# Patient Record
Sex: Male | Born: 1975 | Race: White | Hispanic: No | Marital: Married | Smoking: Never smoker
Health system: Southern US, Community
[De-identification: ages and names within clinical notes are randomized; demographics above are authoritative.]

## PROBLEM LIST (undated history)

## (undated) DIAGNOSIS — F4310 Post-traumatic stress disorder, unspecified: Secondary | ICD-10-CM

## (undated) DIAGNOSIS — F32A Depression, unspecified: Secondary | ICD-10-CM

## (undated) DIAGNOSIS — F329 Major depressive disorder, single episode, unspecified: Secondary | ICD-10-CM

## (undated) HISTORY — DX: Depression, unspecified: F32.A

## (undated) HISTORY — DX: Post-traumatic stress disorder, unspecified: F43.10

## (undated) HISTORY — PX: MOUTH SURGERY: SHX715

## (undated) HISTORY — DX: Major depressive disorder, single episode, unspecified: F32.9

## (undated) HISTORY — PX: WISDOM TOOTH EXTRACTION: SHX21

---

## 2015-01-20 ENCOUNTER — Institutional Professional Consult (permissible substitution): Payer: BLUE CROSS/BLUE SHIELD | Admitting: Neurology

## 2015-01-20 ENCOUNTER — Telehealth: Payer: Self-pay | Admitting: Neurology

## 2015-01-20 NOTE — Telephone Encounter (Signed)
Pt is in the phone. He is on [address], [place], he is running late for appt. Should he come on or RS?

## 2015-01-20 NOTE — Telephone Encounter (Signed)
Pt needs to reschedule. He has missed more than half of his appt.

## 2015-04-24 DIAGNOSIS — Z0289 Encounter for other administrative examinations: Secondary | ICD-10-CM

## 2015-06-12 ENCOUNTER — Ambulatory Visit (INDEPENDENT_AMBULATORY_CARE_PROVIDER_SITE_OTHER): Payer: BLUE CROSS/BLUE SHIELD | Admitting: Neurology

## 2015-06-12 ENCOUNTER — Encounter: Payer: Self-pay | Admitting: Neurology

## 2015-06-12 ENCOUNTER — Encounter (INDEPENDENT_AMBULATORY_CARE_PROVIDER_SITE_OTHER): Payer: Self-pay

## 2015-06-12 VITALS — BP 144/100 | HR 94 | Resp 20 | Ht 73.0 in | Wt 229.0 lb

## 2015-06-12 DIAGNOSIS — R0683 Snoring: Secondary | ICD-10-CM | POA: Diagnosis not present

## 2015-06-12 DIAGNOSIS — G479 Sleep disorder, unspecified: Secondary | ICD-10-CM

## 2015-06-12 DIAGNOSIS — G4730 Sleep apnea, unspecified: Secondary | ICD-10-CM

## 2015-06-12 DIAGNOSIS — R5383 Other fatigue: Secondary | ICD-10-CM | POA: Diagnosis not present

## 2015-06-12 DIAGNOSIS — G4700 Insomnia, unspecified: Secondary | ICD-10-CM | POA: Diagnosis not present

## 2015-06-12 NOTE — Patient Instructions (Signed)
Insomnia Insomnia is a sleep disorder that makes it difficult to fall asleep or to stay asleep. Insomnia can cause tiredness (fatigue), low energy, difficulty concentrating, mood swings, and poor performance at work or school.  There are three different ways to classify insomnia:  Difficulty falling asleep.  Difficulty staying asleep.  Waking up too early in the morning. Any type of insomnia can be long-term (chronic) or short-term (acute). Both are common. Short-term insomnia usually lasts for three months or less. Chronic insomnia occurs at least three times a week for longer than three months. CAUSES  Insomnia may be caused by another condition, situation, or substance, such as:  Anxiety.  Certain medicines.  Gastroesophageal reflux disease (GERD) or other gastrointestinal conditions.  Asthma or other breathing conditions.  Restless legs syndrome, sleep apnea, or other sleep disorders.  Chronic pain.  Menopause. This may include hot flashes.  Stroke.  Abuse of alcohol, tobacco, or illegal drugs.  Depression.  Caffeine.   Neurological disorders, such as Alzheimer disease.  An overactive thyroid (hyperthyroidism). The cause of insomnia may not be known. RISK FACTORS Risk factors for insomnia include:  Gender. Women are more commonly affected than men.  Age. Insomnia is more common as you get older.  Stress. This may involve your professional or personal life.  Income. Insomnia is more common in people with lower income.  Lack of exercise.   Irregular work schedule or night shifts.  Traveling between different time zones. SIGNS AND SYMPTOMS If you have insomnia, trouble falling asleep or trouble staying asleep is the main symptom. This may lead to other symptoms, such as:  Feeling fatigued.  Feeling nervous about going to sleep.  Not feeling rested in the morning.  Having trouble concentrating.  Feeling irritable, anxious, or depressed. TREATMENT   Treatment for insomnia depends on the cause. If your insomnia is caused by an underlying condition, treatment will focus on addressing the condition. Treatment may also include:   Medicines to help you sleep.  Counseling or therapy.  Lifestyle adjustments. HOME CARE INSTRUCTIONS   Take medicines only as directed by your health care provider.  Keep regular sleeping and waking hours. Avoid naps.  Keep a sleep diary to help you and your health care provider figure out what could be causing your insomnia. Include:   When you sleep.  When you wake up during the night.  How well you sleep.   How rested you feel the next day.  Any side effects of medicines you are taking.  What you eat and drink.   Make your bedroom a comfortable place where it is easy to fall asleep:  Put up shades or special blackout curtains to block light from outside.  Use a white noise machine to block noise.  Keep the temperature cool.   Exercise regularly as directed by your health care provider. Avoid exercising right before bedtime.  Use relaxation techniques to manage stress. Ask your health care provider to suggest some techniques that may work well for you. These may include:  Breathing exercises.  Routines to release muscle tension.  Visualizing peaceful scenes.  Cut back on alcohol, caffeinated beverages, and cigarettes, especially close to bedtime. These can disrupt your sleep.  Do not overeat or eat spicy foods right before bedtime. This can lead to digestive discomfort that can make it hard for you to sleep.  Limit screen use before bedtime. This includes:  Watching TV.  Using your smartphone, tablet, and computer.  Stick to a routine. This   can help you fall asleep faster. Try to do a quiet activity, brush your teeth, and go to bed at the same time each night.  Get out of bed if you are still awake after 15 minutes of trying to sleep. Keep the lights down, but try reading or  doing a quiet activity. When you feel sleepy, go back to bed.  Make sure that you drive carefully. Avoid driving if you feel very sleepy.  Keep all follow-up appointments as directed by your health care provider. This is important. SEEK MEDICAL CARE IF:   You are tired throughout the day or have trouble in your daily routine due to sleepiness.  You continue to have sleep problems or your sleep problems get worse. SEEK IMMEDIATE MEDICAL CARE IF:   You have serious thoughts about hurting yourself or someone else.   This information is not intended to replace advice given to you by your health care provider. Make sure you discuss any questions you have with your health care provider.   Document Released: 05/21/2000 Document Revised: 02/12/2015 Document Reviewed: 02/22/2014 Elsevier Interactive Patient Education 2016 [employer] Inc[employer]  

## 2015-06-12 NOTE — Progress Notes (Signed)
SLEEP MEDICINE CLINIC   Provider:  Melvyn Novasarmen  Aimee Timmons, M D  Referring Provider: Albertina Parr'Neal, Leslie C, NP Primary Care Physician:  No primary care provider on file.  Chief Complaint  Patient presents with  . New Patient (Initial Visit)    snoring, excessively tired during the daytime, rm 11, alone    HPI:  Randy Gonzales is a 40 y.o. male , seen here as a referral from NP O'Neal for a sleep evaluation. Mr. Randy Gonzales is a 40 year old married Caucasian father of 2, who reports that he feels neither restored normal refreshed after a night of sleep. He feels tired not necessarily excessively sleepy but fatigued and stressed out. He has been witnessed to snore and have apneas by his wife. His sleep is also fragmented and he wakes up spontaneously up multiple times at night, only one time would be related to the urge to urinate. Since the couple has young children some of the interruptions of sleep related to the activities of the youngsters. He does not feel her desire necessarily to sleep in daytime and does not doze off when physically inactive or mentally not stimulated. He reports that he desires to sleep that he does not have any associated fevers of nightmares, or any panic or anxiety disorder that would be affecting and waking him from sleep. He used to worry more at night, had recurent thoughts, but this changed with becoming a father. He goes easier sleep, just not worries as much. 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 [place] , non combat.   He does not report frequent headaches. During this winter season with the heated forced air he often has a dry mouth and is congested in his nasal passage.  He works regular daytime hours, and he uses a lot of caffeine during the day. Usually he drinks soda and tea but not coffee. He estimates his daily caffeine beverage use to be about 2-4 large cups. 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    Sleep habits are as follows: Sometimes he  falls asleep putting the children to bed, usually around 9.30- 10  pm is his bedtime. He would be asleep within 30 minutes, he wakes up multiple times at variable times.  Marital bedroom is described as core, quiet and dark. Sometimes one of the children do wonder in the most nights this is not the case. The couple also has a cat and a dog but these do not share the same bedroom. His wife has noted him to snore and she feels that he moves around in bed during the night a lot. The patient himself confirms this. She also experiences pauses of breathing , while he is not feeling as if he has a breath holding spell at night. He has never woken up gasping or air hungry or panic due to irregular breathing. He endorsed the Epworth sleepiness score at only 3 points which is below average but the fatigue severity score was endorsed at 51 points. The degree of fatigue seems to have increased over the last year, beginning over 3 years ago.     Sleep medical history and family sleep history:  Weight gain seemed to have induced the snoring.  Positive paternal or maternal history for  snoring , paternal uncle with OSA and on CPAP, morbidly obese.   Social history: married, full time gainfully employed, father of 2 , non smoker.  Non ETOH, social - not alone. .   Review of Systems: Out  of a complete 14 system review, the patient complains of only the following symptoms, and all other reviewed systems are negative.  Epworth score 3  , Fatigue severity score 52  , depression score see below PHQ 9.    Social History   Social History  . Marital Status: Unknown    Spouse Name: N/A  . Number of Children: N/A  . Years of Education: N/A   Occupational History  . [employer]    Social History Main Topics  . Smoking status: Never Smoker   . Smokeless tobacco: Not on file  . Alcohol Use: 0.0 oz/week    0 Standard drinks or equivalent per week     Comment: socially  . Drug Use: Not on file  . Sexual Activity:  Not on file   Other Topics Concern  . Not on file   Social History Narrative   Consumes 2-3 caffeinated beverages daily.    Family History  Problem Relation Age of Onset  . Allergies Mother   . Asthma Father   . Allergies Brother   . Diabetes Mellitus II Paternal Uncle   . Prostate cancer Paternal Uncle   . Liver cancer Paternal Uncle   . Depression Paternal Uncle     Past Medical History  Diagnosis Date  . Depression   . PTSD (post-traumatic stress disorder)   . Depression     Past Surgical History  Procedure Laterality Date  . Wisdom tooth extraction    . Mouth surgery      teeth fixed, teeth pulled out, teeth chipped    Current Outpatient Prescriptions  Medication Sig Dispense Refill  . DULoxetine (CYMBALTA) 60 MG capsule Take 60 mg by mouth daily.    . flintstones complete (FLINTSTONES) 60 MG chewable tablet Chew 1 tablet by mouth daily.     No current facility-administered medications for this visit.    Allergies as of 06/12/2015  . (No Known Allergies)    Vitals: BP 144/100 mmHg  Pulse 94  Resp 20  Ht 6\' 1"  (1.854 m)  Wt 229 lb (103.874 kg)  BMI 30.22 kg/m2 Last Weight:  Wt Readings from Last 1 Encounters:  06/12/15 229 lb (103.874 kg)   XBJ:YNWG mass index is 30.22 kg/(m^2).     Last Height:   Ht Readings from Last 1 Encounters:  06/12/15 6\' 1"  (1.854 m)    Physical exam:  General: The patient is awake, alert and appears not in acute distress. The patient is well groomed. Head: Normocephalic, atraumatic. Neck is supple. Mallampati,  neck circumference: 17. Nasal airflow unrestricted , TMJ is  evident . Retrognathia is seen.  Cardiovascular:  Regular rate and rhythm , without  murmurs or carotid bruit, and without distended neck veins. Respiratory: Lungs are clear to auscultation. Skin:  Without evidence of edema, or rash Trunk: BMI is elevated . The patient's posture is hunched.  Neurologic exam : The patient is awake and alert, oriented  to place and time.   Memory subjective  described as intact.   Attention span & concentration ability appears normal.  Speech is fluent,  Without dysarthria, dysphonia or aphasia.  Mood and affect are appropriate.  Cranial nerves: Pupils are equal and briskly reactive to light. Funduscopic exam deferred .  Extraocular movements  in vertical and horizontal planes intact and without nystagmus. Visual fields by finger perimetry are intact. Hearing to finger rub intact.   Facial sensation intact to fine touch.  Facial motor strength is symmetric and tongue and  uvula move midline. Shoulder shrug was symmetrical.   Motor exam:  Elevated tone, failure to relax. symmetric tone , muscle bulk and symmetric strength in all extremities.  Sensory:  Fine touch, pinprick and vibration were tested in all extremities. Proprioception tested in the upper extremities was normal.  Coordination:  Finger-to-nose maneuver  normal without evidence of ataxia, dysmetria or tremor.  Gait and station: Patient walks without assistive device and is able unassisted to climb up to the exam table. Strength within normal limits.  Stance is stable and normal.  Deep tendon reflexes: in the  upper and lower extremities are symmetric and intact. DTR patellae are brisk. No clonus   Babinski maneuver response is  downgoing.  The patient was advised of the nature of the diagnosed sleep disorder , the treatment options and risks for general a health and wellness arising from not treating the condition.  I spent more than 40 minutes of face to face time with the patient. Greater than 50% of time was spent in counseling and coordination of care. We have discussed the diagnosis and differential and I answered the patient's questions.     Assessment:  After physical and neurologic examination, review of laboratory studies,  Personal review of imaging studies, reports of other /same  Imaging studies ,  Results of polysomnography/  neurophysiology testing and pre-existing records as far as provided in visit., my assessment is   Mr. Randy Gonzales has an unremarkable general neurologic exam. The only 2 neuromuscular findings were a elevated tone in both upper extremities, and brisk patella reflexes. He occasionally reports some by cramping or lower back pain. This may also be related to posture at work since he works at [employer]. His desk space allows him to look at her window but there is no natural daylight directly cost at his workplace. His fatigue has increased over the last 3 years perhaps but over the last year has become more so and somewhat burdensome, but is not associated with excessive daytime sleepiness. Based on his wife's report that he snores and stops breathing at night I assume that he will have some degree of apnea. I will invite him for an attended sleep study with the goal to monitor for nocturnal breathing in the lateral as well as in the supine sleep position. If we find an apnea index of over 15 per hour of sleep CPAP should be initiated. For milder apneas not associated with hypoxemia a dental device is often sufficient and treat snoring just as well. There is also less complication with claustrophobia.  2) I order a PSG split with parasomnia montage - good video and audio needed. PTSD patient.   3) I was checked for Vit D deficiency, positive. 2000 units of Vit D daily,  testosterone deficiency is not as likely as he reports no loss of libido.      B complex recommended. Try a 5 mg melatonin.    Plan:  Treatment plan and additional workup : sleep hygiene improvement discussed.      Randy Mylar Artemis Loyal MD  06/12/2015   CC: Randy Parr [address] [place], [place] [address]

## 2018-04-17 ENCOUNTER — Encounter (HOSPITAL_COMMUNITY): Payer: Self-pay

## 2018-04-17 ENCOUNTER — Emergency Department (HOSPITAL_COMMUNITY): Payer: BLUE CROSS/BLUE SHIELD

## 2018-04-17 ENCOUNTER — Emergency Department (HOSPITAL_COMMUNITY)
Admission: EM | Admit: 2018-04-17 | Discharge: 2018-04-17 | Disposition: A | Discharge status: Home / Self Care | Payer: BLUE CROSS/BLUE SHIELD | Attending: Emergency Medicine | Admitting: Emergency Medicine

## 2018-04-17 DIAGNOSIS — R202 Paresthesia of skin: Secondary | ICD-10-CM | POA: Insufficient documentation

## 2018-04-17 DIAGNOSIS — R200 Anesthesia of skin: Secondary | ICD-10-CM | POA: Diagnosis present

## 2018-04-17 DIAGNOSIS — M542 Cervicalgia: Secondary | ICD-10-CM | POA: Diagnosis not present

## 2018-04-17 LAB — CBC WITH DIFFERENTIAL/PLATELET
Abs Immature Granulocytes: 0.03 10*3/uL (ref 0.00–0.07)
BASOS PCT: 0 %
Basophils Absolute: 0 10*3/uL (ref 0.0–0.1)
EOS ABS: 0 10*3/uL (ref 0.0–0.5)
EOS PCT: 0 %
HEMATOCRIT: 44.3 % (ref 39.0–52.0)
Hemoglobin: 14.6 g/dL (ref 13.0–17.0)
Immature Granulocytes: 1 %
LYMPHS ABS: 0.9 10*3/uL (ref 0.7–4.0)
Lymphocytes Relative: 14 %
MCH: 29 pg (ref 26.0–34.0)
MCHC: 33 g/dL (ref 30.0–36.0)
MCV: 87.9 fL (ref 80.0–100.0)
MONOS PCT: 2 %
Monocytes Absolute: 0.1 10*3/uL (ref 0.1–1.0)
Neutro Abs: 5.3 10*3/uL (ref 1.7–7.7)
Neutrophils Relative %: 83 %
Platelets: 259 10*3/uL (ref 150–400)
RBC: 5.04 MIL/uL (ref 4.22–5.81)
RDW: 12.1 % (ref 11.5–15.5)
WBC: 6.3 10*3/uL (ref 4.0–10.5)
nRBC: 0 % (ref 0.0–0.2)

## 2018-04-17 LAB — COMPREHENSIVE METABOLIC PANEL
ALBUMIN: 4.3 g/dL (ref 3.5–5.0)
ALT: 75 U/L — ABNORMAL HIGH (ref 0–44)
ANION GAP: 6 (ref 5–15)
AST: 39 U/L (ref 15–41)
Alkaline Phosphatase: 36 U/L — ABNORMAL LOW (ref 38–126)
BILIRUBIN TOTAL: 0.5 mg/dL (ref 0.3–1.2)
BUN: 9 mg/dL (ref 6–20)
CHLORIDE: 106 mmol/L (ref 98–111)
CO2: 25 mmol/L (ref 22–32)
Calcium: 9.5 mg/dL (ref 8.9–10.3)
Creatinine, Ser: 1.09 mg/dL (ref 0.61–1.24)
GFR calc Af Amer: >60 mL/min (ref >60)
GLUCOSE: 135 mg/dL — AB (ref 70–99)
POTASSIUM: 4 mmol/L (ref 3.5–5.1)
Sodium: 137 mmol/L (ref 135–145)
Total Protein: 7.5 g/dL (ref 6.5–8.1)

## 2018-04-17 LAB — I-STAT TROPONIN, ED: Troponin i, poc: 0 ng/mL (ref 0.00–0.08)

## 2018-04-17 MED ORDER — NAPROXEN 500 MG PO TABS: 500.0000 mg | ORAL_TABLET | Freq: Two times a day (BID) | ORAL | 0 refills | Status: AC

## 2018-04-17 NOTE — Discharge Instructions (Addendum)
I have provided referral to a cardiologist and neurologist please schedule an appointment at your earliest convenience.  Your work-up today was reassuring further evaluation is needed as far as your EKG, right hand tingling.  Also provided some naproxen which should help with the right hand tingling and pain.  You experience any worsening symptoms, fever, chest pain, shortness of breath please return to the ED for reevaluation.

## 2018-04-17 NOTE — ED Notes (Signed)
Pt @ xray at this time.

## 2018-04-17 NOTE — ED Triage Notes (Signed)
Pt presents with R arm numbness that he noted upon awakening this morning.  Pt denies any injury, was seen at an urgent care and referred here.  Pt reports having neck pain a few days ago.

## 2018-04-17 NOTE — ED Notes (Signed)
Pt returned from CT.  Pt remains alert and oriented x's 3, without any complaints voiced

## 2018-04-17 NOTE — ED Notes (Signed)
Pt st's he had pain in his neck on Thursday.  Today was seen at a Urgent Care where they did x-rays of his neck..  Pt st's they think he may have a pinched nerve in his neck.

## 2018-04-17 NOTE — ED Provider Notes (Signed)
[facility] EMERGENCY DEPARTMENT Provider Note   CSN: [id number] Arrival date & time: 04/17/18  1608     History   Chief Complaint Chief Complaint  Patient presents with  . Numbness    HPI Randy Gonzales is a 42 y.o. male.  42 y.o male with a PMH of depression, PTSD presents to the ED with a chief complaint of neck pain and right arm tingling x 4 days. Patient also reports he woke up this morning and his right arm felt numb, which has happened before and he does not know if he slept on his arm. He was seen at [facility] and advised go to the ED due to "abnormality on his EKG". He also received a shot of Toradol which he reports helped with his pain. He currently reports no pain with neck flexion or extension but does report the pain in his right shoulder is worse with movement. He denies any smoking history, previous blood clots or CAD history.      Past Medical History:  Diagnosis Date  . Depression   . Depression   . PTSD (post-traumatic stress disorder)     There are no active problems to display for this patient.   Past Surgical History:  Procedure Laterality Date  . MOUTH SURGERY     teeth fixed, teeth pulled out, teeth chipped  . WISDOM TOOTH EXTRACTION          Home Medications    Prior to Admission medications   Medication Sig Start Date End Date Taking? Authorizing Provider  naproxen (NAPROSYN) 500 MG tablet Take 1 tablet (500 mg total) by mouth 2 (two) times daily for 7 days. 04/17/18 04/24/18  Claude Manges, PA-C    Family History Family History  Problem Relation Age of Onset  . Allergies Mother   . Asthma Father   . Allergies Brother   . Diabetes Mellitus II Paternal Uncle   . Prostate cancer Paternal Uncle   . Liver cancer Paternal Uncle   . Depression Paternal Uncle     Social History Social History   Tobacco Use  . Smoking status: Never Smoker  . Smokeless tobacco: Never Used  Substance Use Topics  . Alcohol  use: Yes    Alcohol/week: 0.0 standard drinks    Comment: socially  . Drug use: Not on file     Allergies   Patient has no known allergies.   Review of Systems Review of Systems  Constitutional: Negative for chills and fever.  HENT: Negative for sore throat.   Eyes: Negative for pain and visual disturbance.  Respiratory: Negative for cough and shortness of breath.   Cardiovascular: Negative for chest pain and palpitations.  Gastrointestinal: Negative for abdominal pain, nausea and vomiting.  Genitourinary: Negative for dysuria and hematuria.  Musculoskeletal: Positive for arthralgias and neck pain. Negative for back pain and neck stiffness.  Skin: Negative for color change and rash.  Neurological: Negative for seizures and syncope.  All other systems reviewed and are negative.    Physical Exam Updated Vital Signs BP 128/81 (BP Location: Right Arm)   Pulse 82   Temp 98.1 F (36.7 C) (Oral)   Resp 16   Ht 6' (1.829 m)   Wt 105.2 kg   SpO2 93%   BMI 31.46 kg/m   Physical Exam  Constitutional: He is oriented to person, place, and time. He appears well-developed and well-nourished.  HENT:  Head: Normocephalic and atraumatic.  Mouth/Throat: Oropharynx is clear and  moist.  Eyes: Pupils are equal, round, and reactive to light. No scleral icterus.  Neck: Randy range of motion.  Cardiovascular: Randy heart sounds.  Pulmonary/Chest: Effort Randy and breath sounds Randy. He has no wheezes. He exhibits no tenderness.  Abdominal: Soft. Bowel sounds are Randy. He exhibits no distension. There is no tenderness.  Musculoskeletal: He exhibits no tenderness or deformity.       Right shoulder: He exhibits Randy range of motion, no tenderness, no bony tenderness, no swelling and no effusion.       Arms: No tenderness to palpation along the right hand, pulses are present and capillary refill is intact.  There is pain with movement of the right shoulder no limited range of motion.   Neurological: He is alert and oriented to person, place, and time.  Alert, oriented, thought content appropriate. Speech fluent without evidence of aphasia. Able to follow 2 step commands without difficulty.  Cranial Nerves:  II:  Peripheral visual fields grossly Randy, pupils, round, reactive to light III,IV, VI: ptosis not present, extra-ocular motions intact bilaterally  V,VII: smile symmetric, facial light touch sensation equal VIII: hearing grossly Randy bilaterally  IX,X: midline uvula rise  XI: bilateral shoulder shrug equal and strong XII: midline tongue extension  Motor:  5/5 in upper and lower extremities bilaterally including strong and equal grip strength and dorsiflexion/plantar flexion Sensory: light touch Randy in all extremities.  Cerebellar: Randy finger-to-nose with bilateral upper extremities, pronator drift negative Gait: Randy gait and balance   Skin: Skin is warm and dry.  Nursing note and vitals reviewed.    ED Treatments / Results  Labs (all labs ordered are listed, but only abnormal results are displayed) Labs Reviewed  COMPREHENSIVE METABOLIC PANEL - Abnormal; Notable for the following components:      Result Value   Glucose, Bld 135 (*)    ALT 75 (*)    Alkaline Phosphatase 36 (*)    All other components within Randy limits  CBC WITH DIFFERENTIAL/PLATELET  I-STAT TROPONIN, ED    EKG EKG Interpretation  Date/Time:  Monday April 17 2018 16:15:51 EST Ventricular Rate:  88 PR Interval:    QRS Duration: 114 QT Interval:  341 QTC Calculation: 413 R Axis:   -36 Text Interpretation:  Sinus rhythm Incomplete RBBB and LAFB Abnormal R-wave progression, late transition No previous ECGs available Confirmed by Vanetta Mulders [id number]) on 04/17/2018 4:17:57 PM   Radiology Dg Chest 2 View  Result Date: 04/17/2018 CLINICAL DATA:  Abnormal electrocardiogram. Recent chest congestion. Cough. EXAM: CHEST - 2 VIEW COMPARISON:  None. FINDINGS: Lungs  are clear. Heart size and pulmonary vascularity are Randy. No adenopathy. No evident bone lesions. IMPRESSION: No edema or consolidation. Electronically Signed   By: Bretta Bang III M.D.   On: 04/17/2018 17:46   Dg Cervical Spine 2-3 Views  Result Date: 04/17/2018 CLINICAL DATA:  Cervicalgia with right upper extremity numbness EXAM: CERVICAL SPINE - 2-3 VIEW COMPARISON:  None. FINDINGS: Frontal, lateral, and open-mouth odontoid images obtained. There is no evident fracture or spondylolisthesis. Prevertebral soft tissues and predental space regions are Randy. The disc spaces appear unremarkable. Lung apices are clear. IMPRESSION: No fracture or spondylolisthesis.  No evident arthropathy. Electronically Signed   By: Bretta Bang III M.D.   On: 04/17/2018 17:47   Ct Cervical Spine Wo Contrast  Result Date: 04/17/2018 CLINICAL DATA:  42 year old male with neck pain since last week. Right arm numbness today. EXAM: CT CERVICAL SPINE WITHOUT CONTRAST TECHNIQUE: Multidetector CT  imaging of the cervical spine was performed without intravenous contrast. Multiplanar CT image reconstructions were also generated. COMPARISON:  Cervical spine radiographs 1658 hours today. FINDINGS: Alignment: Preserved cervical lordosis. Cervicothoracic junction alignment is within Randy limits. Bilateral posterior element alignment is within Randy limits. Skull base and vertebrae: Visualized skull base is intact. No atlanto-occipital dissociation. No acute osseous abnormality identified. Soft tissues and spinal canal: No prevertebral fluid or swelling. No visible canal hematoma. Negative noncontrast neck soft tissues. Disc levels: C2-C3:  Negative. C3-C4: Small broad-based right paracentral disc protrusion (series 9, image 41). Mild spinal stenosis suspected. No foraminal stenosis. C4-C5: Small broad-based central disc protrusion on series 9, image 50. Mild spinal stenosis suspected. No foraminal stenosis. C5-C6:  Mild if  any disc bulging.  No stenosis suspected. C6-C7:  Negative. C7-T1: Mild disc bulge and endplate spurring suspected, but no definite stenosis. Upper chest: Intact visible upper thoracic levels. Negative lung apices. Negative noncontrast thoracic inlet. Other: Negative noncontrast visible brain parenchyma. Partially visible maxillary sinus mucosal thickening or retention cysts. Tympanic cavities and mastoids are clear. IMPRESSION: 1. Small cervical disc herniations suspected at C3-C4 and C4-C5 with up to mild spinal stenosis. 2. No cervical foraminal stenosis is evident. No acute osseous abnormality. Electronically Signed   By: Odessa Fleming M.D.   On: 04/17/2018 19:24    Procedures Procedures (including critical care time)  Medications Ordered in ED Medications - No data to display   Initial Impression / Assessment and Plan / ED Course  I have reviewed the triage vital signs and the nursing notes.  Pertinent labs & imaging results that were available during my care of the patient were reviewed by me and considered in my medical decision making (see chart for details).    Presents to the ED sent here from [facility] tented due to an abnormal EKG.  Patient is EKG during visit shows some widening QRS, he reports he began having neck pain on Thursday then later woke up yesterday with tingling in his hand.  Had an x-ray of his neck also, CT cervical spine ordered while in the ED as patient reports he had abnormal results from his x-ray.  CT cervical spine showed:  1. Small cervical disc herniations suspected at C3-C4 and C4-C5 with  up to mild spinal stenosis.  2. No cervical foraminal stenosis is evident. No acute osseous  abnormality.   Patient is neurologically intact, he is got appropriate speech and follows commands.  Reports he is recently been sick with a cold but is unsure on whether he feels "out of it" due to his cold which she treated with Sudafed or other symptoms.  He does not  have a PCP.  I have provided him with a referral to cardiology to further evaluate his EKGs along with obtain a stress test.  Patient understands and agrees with management.  I have also provided him a referral to neurology for his right hand tingling.   Final Clinical Impressions(s) / ED Diagnoses   Final diagnoses:  Paresthesia  Neck pain    ED Discharge Orders         Ordered    naproxen (NAPROSYN) 500 MG tablet  2 times daily     04/17/18 1949           Claude Manges, Cordelia Poche 04/17/18 2011    Vanetta Mulders, MD 04/17/18 2037

## 2018-07-24 DIAGNOSIS — F438 Other reactions to severe stress: Secondary | ICD-10-CM | POA: Diagnosis not present

## 2018-08-07 DIAGNOSIS — F438 Other reactions to severe stress: Secondary | ICD-10-CM | POA: Diagnosis not present

## 2018-08-11 DIAGNOSIS — F4310 Post-traumatic stress disorder, unspecified: Secondary | ICD-10-CM | POA: Diagnosis not present

## 2018-08-17 DIAGNOSIS — Z6832 Body mass index (BMI) 32.0-32.9, adult: Secondary | ICD-10-CM | POA: Diagnosis not present

## 2018-08-17 DIAGNOSIS — R030 Elevated blood-pressure reading, without diagnosis of hypertension: Secondary | ICD-10-CM | POA: Diagnosis not present

## 2018-08-17 DIAGNOSIS — M5412 Radiculopathy, cervical region: Secondary | ICD-10-CM | POA: Diagnosis not present

## 2018-09-01 DIAGNOSIS — F4310 Post-traumatic stress disorder, unspecified: Secondary | ICD-10-CM | POA: Diagnosis not present

## 2018-09-12 DIAGNOSIS — F438 Other reactions to severe stress: Secondary | ICD-10-CM | POA: Diagnosis not present

## 2018-09-22 DIAGNOSIS — F4310 Post-traumatic stress disorder, unspecified: Secondary | ICD-10-CM | POA: Diagnosis not present

## 2018-09-26 DIAGNOSIS — F438 Other reactions to severe stress: Secondary | ICD-10-CM | POA: Diagnosis not present

## 2018-10-06 DIAGNOSIS — F4310 Post-traumatic stress disorder, unspecified: Secondary | ICD-10-CM | POA: Diagnosis not present

## 2018-10-10 DIAGNOSIS — F438 Other reactions to severe stress: Secondary | ICD-10-CM | POA: Diagnosis not present

## 2018-10-20 DIAGNOSIS — F4310 Post-traumatic stress disorder, unspecified: Secondary | ICD-10-CM | POA: Diagnosis not present

## 2018-11-01 DIAGNOSIS — F438 Other reactions to severe stress: Secondary | ICD-10-CM | POA: Diagnosis not present

## 2018-11-03 DIAGNOSIS — F4310 Post-traumatic stress disorder, unspecified: Secondary | ICD-10-CM | POA: Diagnosis not present

## 2018-11-08 DIAGNOSIS — F438 Other reactions to severe stress: Secondary | ICD-10-CM | POA: Diagnosis not present

## 2018-11-16 DIAGNOSIS — F4310 Post-traumatic stress disorder, unspecified: Secondary | ICD-10-CM | POA: Diagnosis not present

## 2018-11-21 DIAGNOSIS — F438 Other reactions to severe stress: Secondary | ICD-10-CM | POA: Diagnosis not present

## 2018-11-30 DIAGNOSIS — F4310 Post-traumatic stress disorder, unspecified: Secondary | ICD-10-CM | POA: Diagnosis not present

## 2018-12-05 DIAGNOSIS — F438 Other reactions to severe stress: Secondary | ICD-10-CM | POA: Diagnosis not present

## 2018-12-14 DIAGNOSIS — F438 Other reactions to severe stress: Secondary | ICD-10-CM | POA: Diagnosis not present

## 2018-12-26 DIAGNOSIS — F438 Other reactions to severe stress: Secondary | ICD-10-CM | POA: Diagnosis not present

## 2019-01-04 DIAGNOSIS — F4310 Post-traumatic stress disorder, unspecified: Secondary | ICD-10-CM | POA: Diagnosis not present

## 2019-01-09 DIAGNOSIS — F438 Other reactions to severe stress: Secondary | ICD-10-CM | POA: Diagnosis not present

## 2019-01-16 DIAGNOSIS — F4310 Post-traumatic stress disorder, unspecified: Secondary | ICD-10-CM | POA: Diagnosis not present

## 2019-01-23 DIAGNOSIS — F438 Other reactions to severe stress: Secondary | ICD-10-CM | POA: Diagnosis not present

## 2019-02-01 DIAGNOSIS — F4310 Post-traumatic stress disorder, unspecified: Secondary | ICD-10-CM | POA: Diagnosis not present

## 2019-02-08 DIAGNOSIS — F438 Other reactions to severe stress: Secondary | ICD-10-CM | POA: Diagnosis not present

## 2019-02-15 DIAGNOSIS — F4310 Post-traumatic stress disorder, unspecified: Secondary | ICD-10-CM | POA: Diagnosis not present

## 2019-02-22 DIAGNOSIS — F438 Other reactions to severe stress: Secondary | ICD-10-CM | POA: Diagnosis not present

## 2019-03-01 DIAGNOSIS — F4310 Post-traumatic stress disorder, unspecified: Secondary | ICD-10-CM | POA: Diagnosis not present

## 2019-03-06 DIAGNOSIS — F438 Other reactions to severe stress: Secondary | ICD-10-CM | POA: Diagnosis not present

## 2019-03-15 DIAGNOSIS — F4310 Post-traumatic stress disorder, unspecified: Secondary | ICD-10-CM | POA: Diagnosis not present

## 2019-03-20 DIAGNOSIS — F438 Other reactions to severe stress: Secondary | ICD-10-CM | POA: Diagnosis not present

## 2019-03-29 DIAGNOSIS — F4310 Post-traumatic stress disorder, unspecified: Secondary | ICD-10-CM | POA: Diagnosis not present

## 2019-04-03 DIAGNOSIS — F438 Other reactions to severe stress: Secondary | ICD-10-CM | POA: Diagnosis not present

## 2019-04-12 DIAGNOSIS — F4310 Post-traumatic stress disorder, unspecified: Secondary | ICD-10-CM | POA: Diagnosis not present

## 2019-04-19 DIAGNOSIS — F438 Other reactions to severe stress: Secondary | ICD-10-CM | POA: Diagnosis not present

## 2019-04-26 DIAGNOSIS — F4310 Post-traumatic stress disorder, unspecified: Secondary | ICD-10-CM | POA: Diagnosis not present

## 2019-04-26 IMAGING — DX DG CHEST 2V
2 series · 2 of 2 positions shown · non-contrast
Comparison: None.

CLINICAL DATA: Abnormal electrocardiogram. Recent chest congestion.
Cough.

EXAM:
CHEST - 2 VIEW

[chest pa]
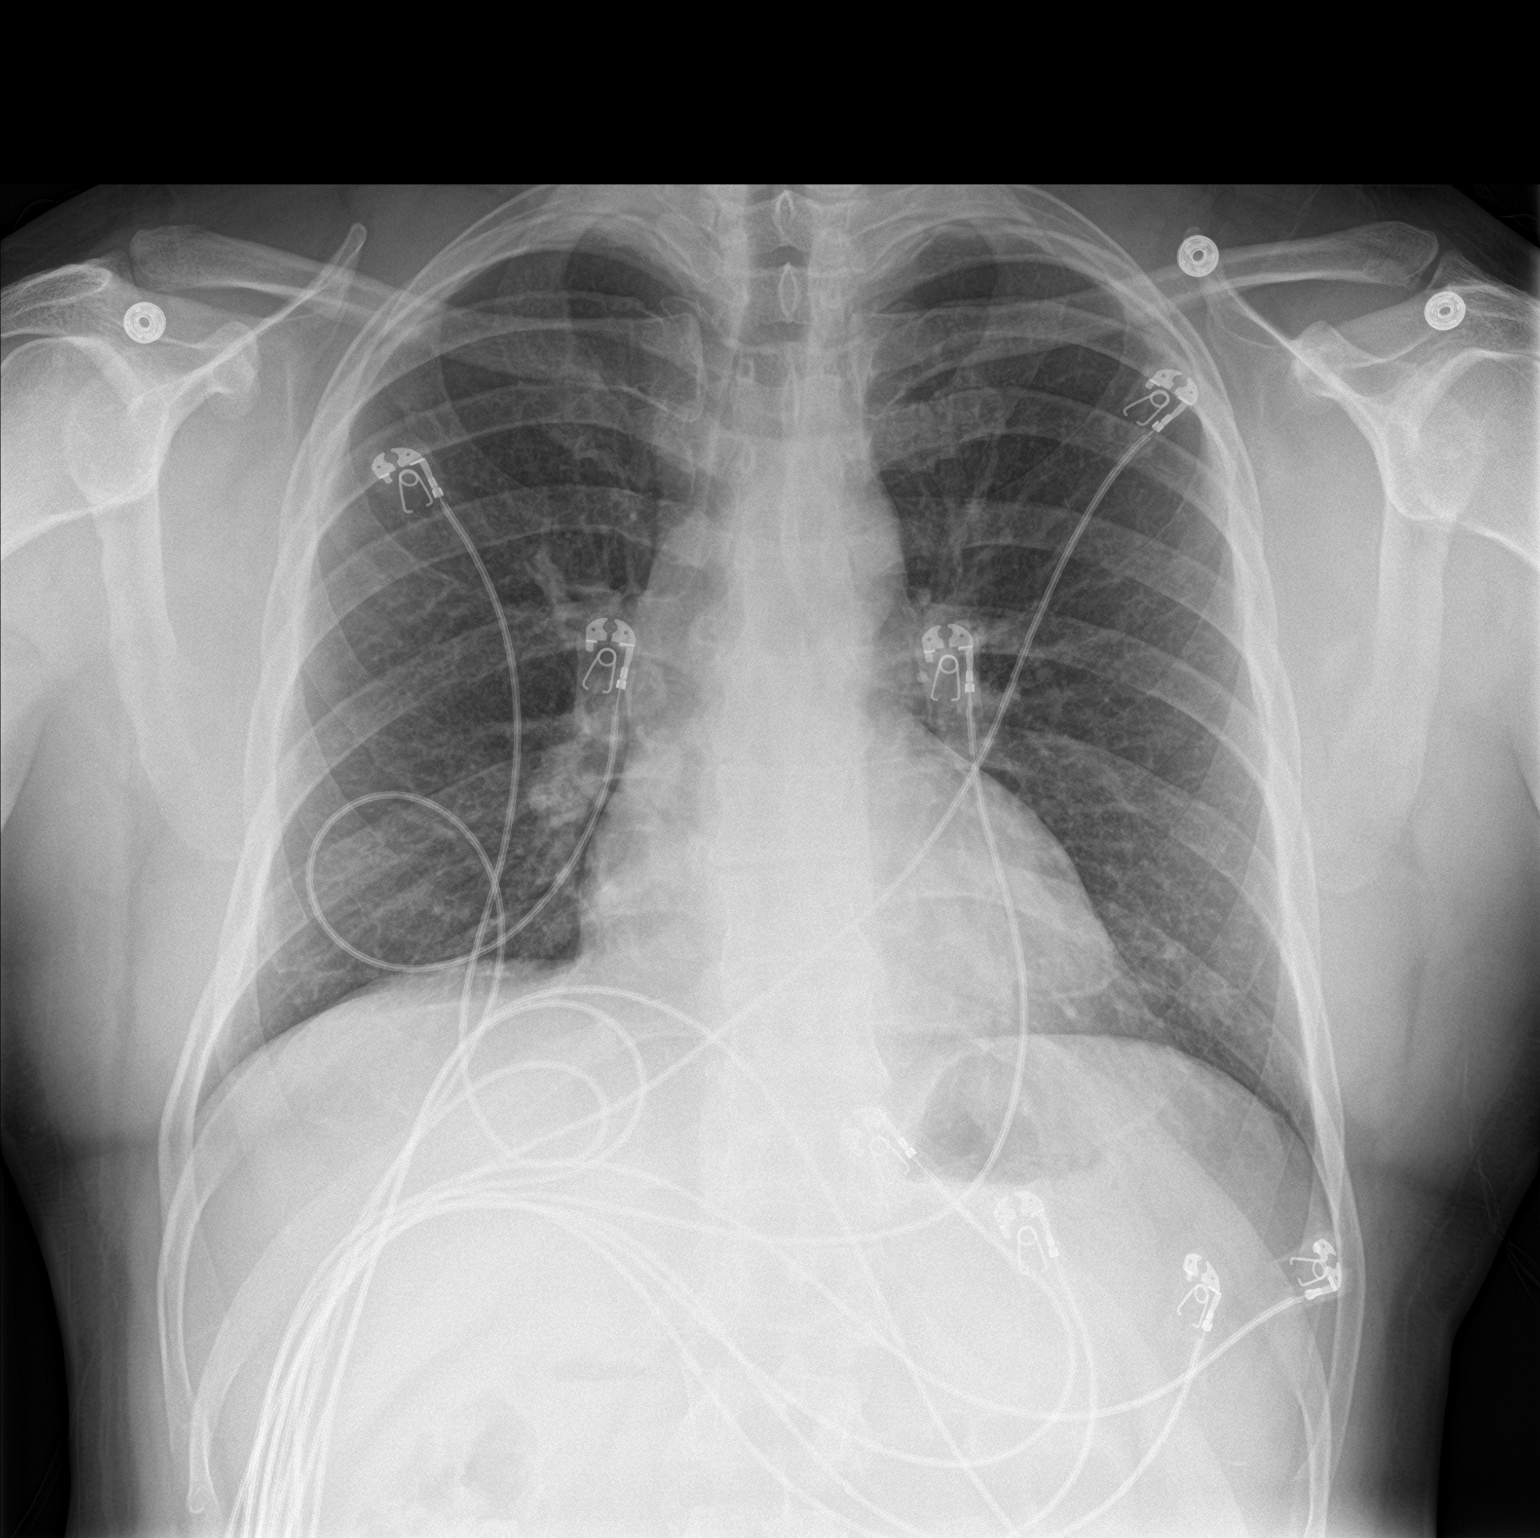

[chest lat]
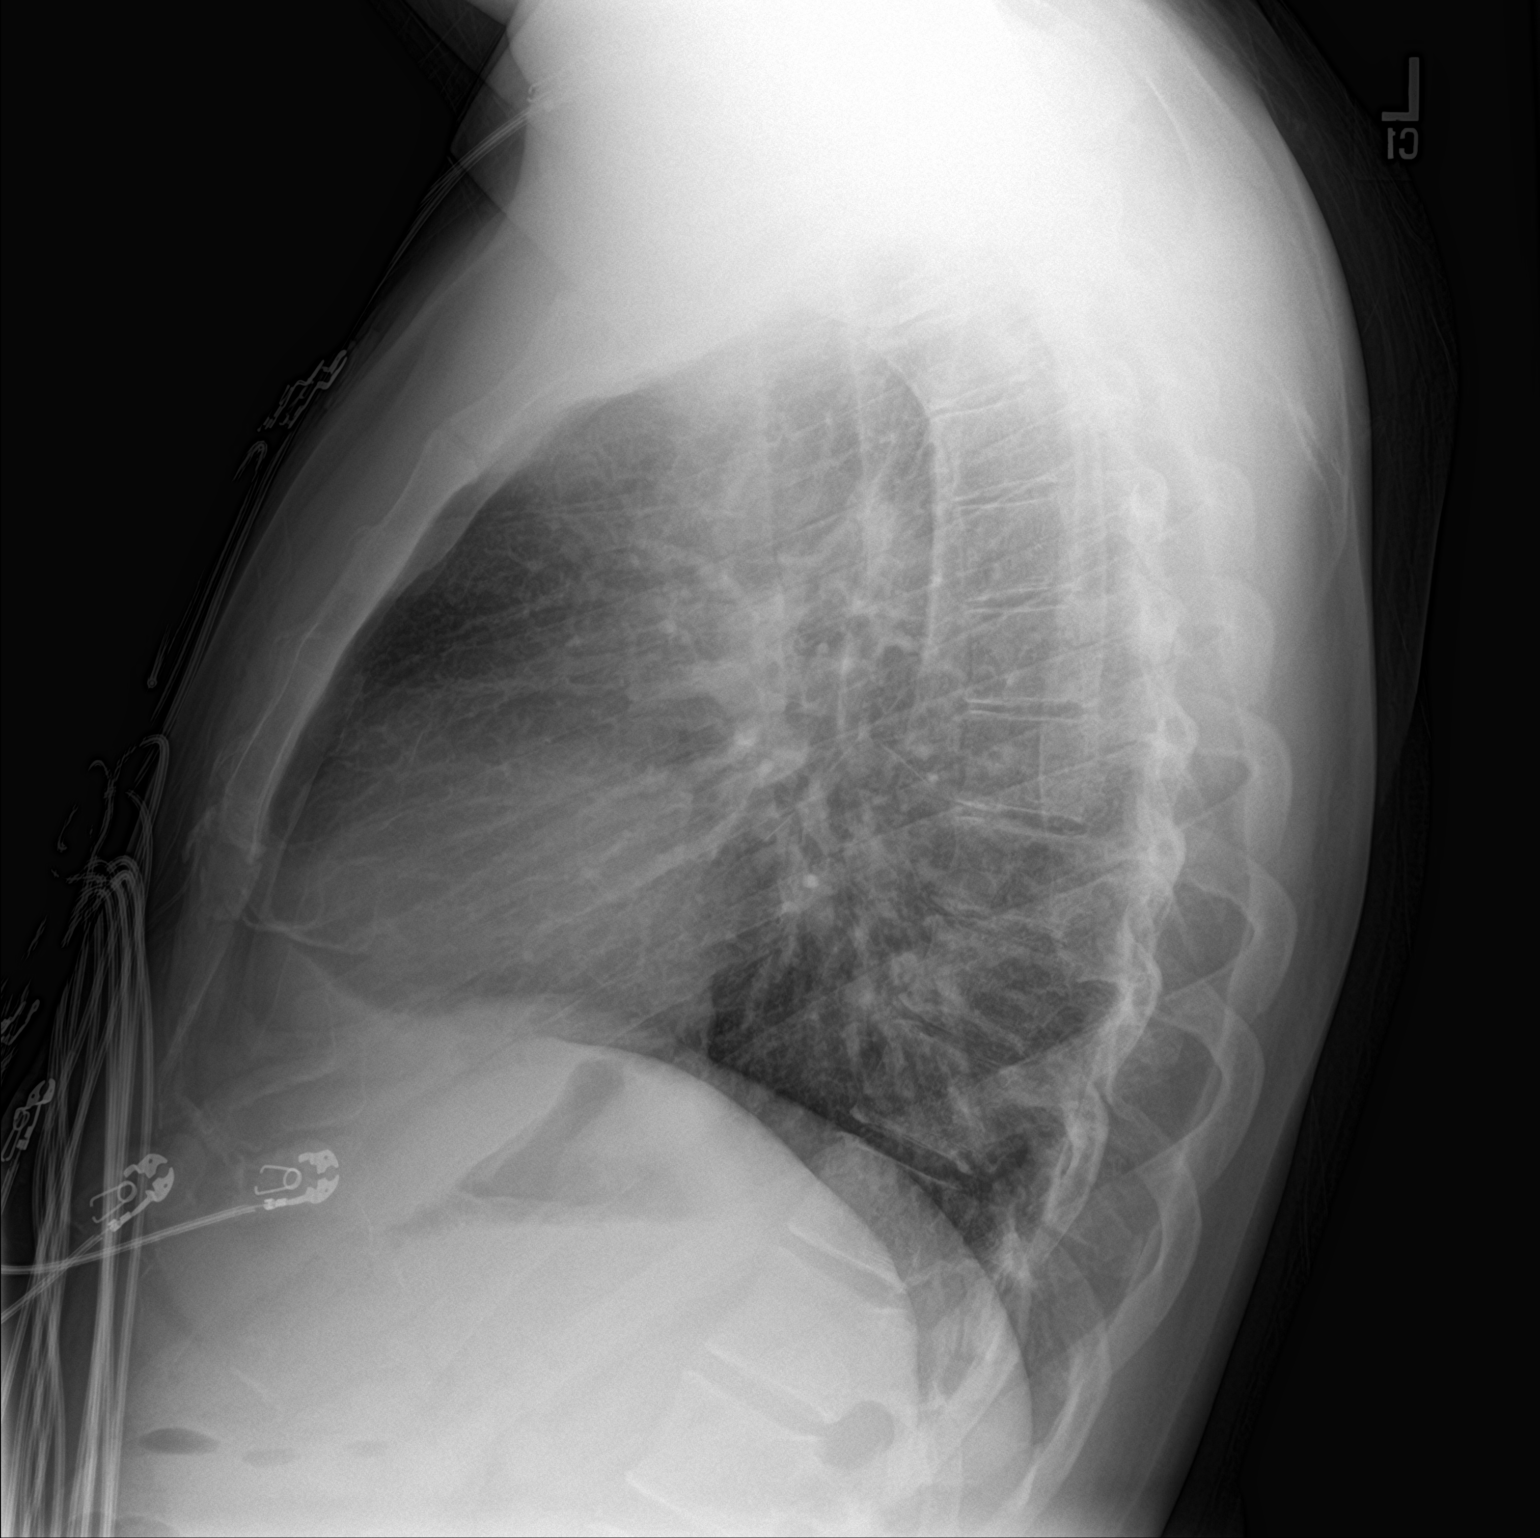

[2 of 2 positions shown; findings below may reference images not displayed]

FINDINGS: Lungs are clear. Heart size and pulmonary vascularity are normal. No
adenopathy. No evident bone lesions.
IMPRESSION: No edema or consolidation.

## 2019-04-26 IMAGING — CT CT CERVICAL SPINE W/O CM
3 of 4 series · 13 of 33 positions shown, 16 images · non-contrast
Comparison: Cervical spine radiographs 7224 hours today.

CLINICAL DATA: 41-year-old male with neck pain since last week.
Right arm numbness today.

EXAM:
CT CERVICAL SPINE WITHOUT CONTRAST
TECHNIQUE: Multidetector CT imaging of the cervical spine was performed without
intravenous contrast. Multiplanar CT image reconstructions were also
generated.

[Series 4: c_spine 2.0 st · axial · 0.31mm/px · z∈[-272,-142]mm · 5 of 99 slices shown, 7 images]
[im 17/99  soft-tissue]
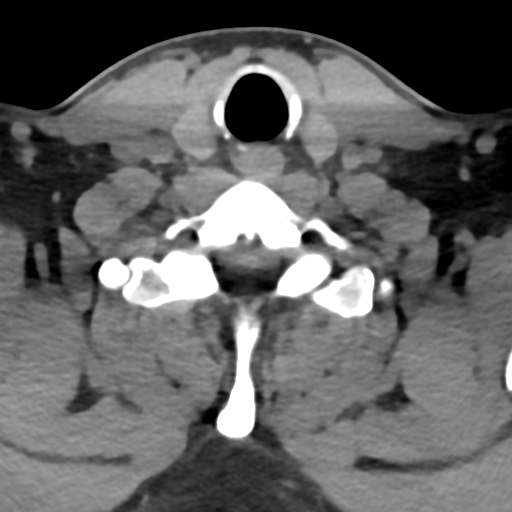
[im 17/99  bone]
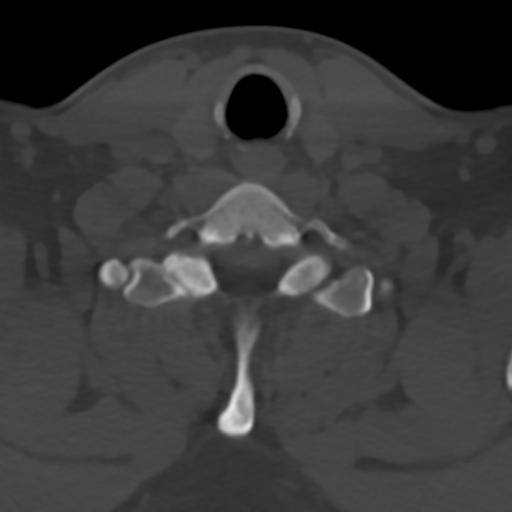
[im 33/99  bone]
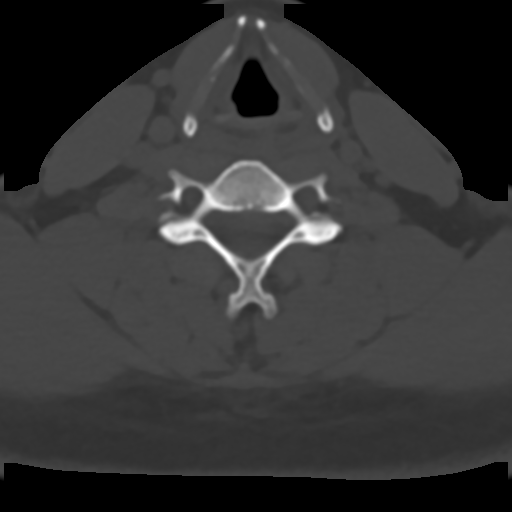
[im 50/99  bone]
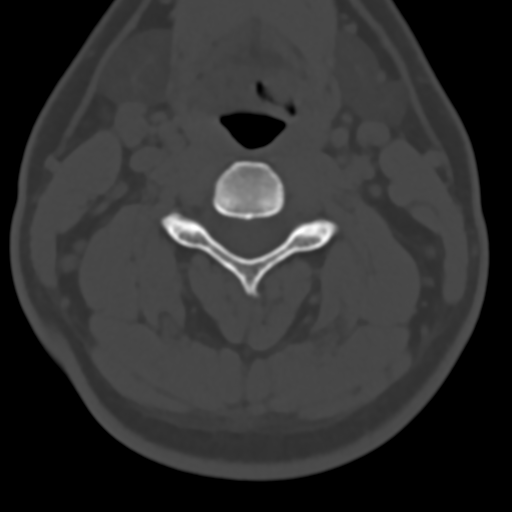
[im 66/99  bone]
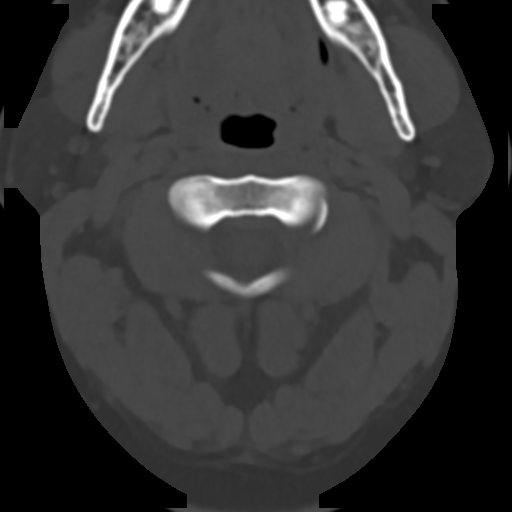
[im 82/99  soft-tissue]
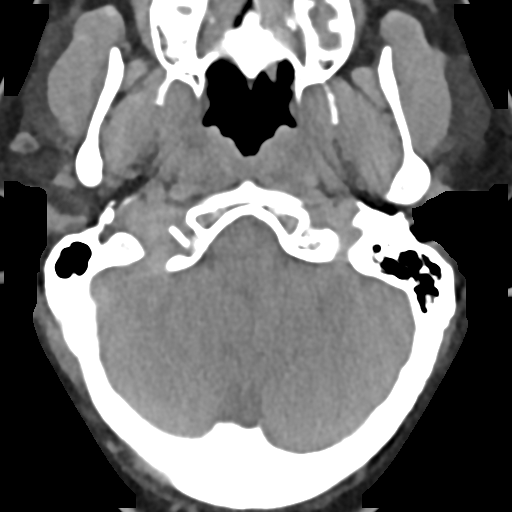
[im 82/99  bone]
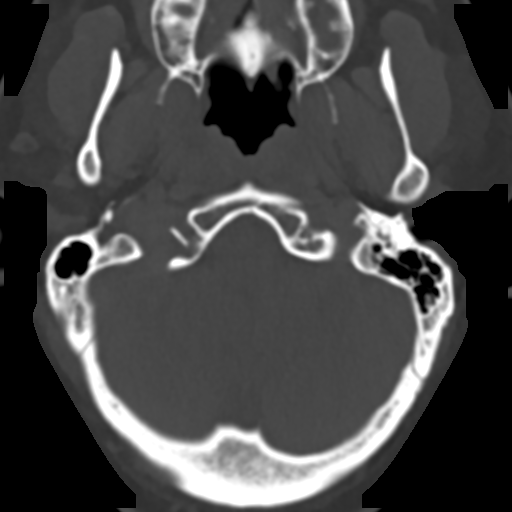

[Series 6: c_spine 2.0 sag bone · sagittal · 0.29mm/px · 5 of 61 slices shown, 6 images]
[im 21/61  bone]
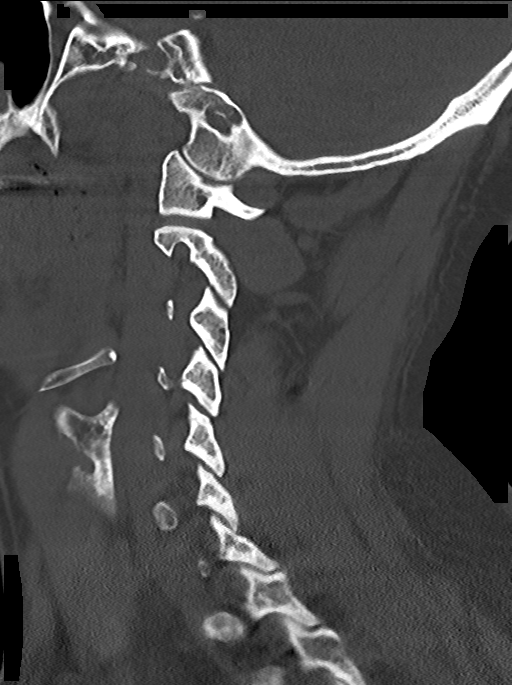
[im 26/61  bone]
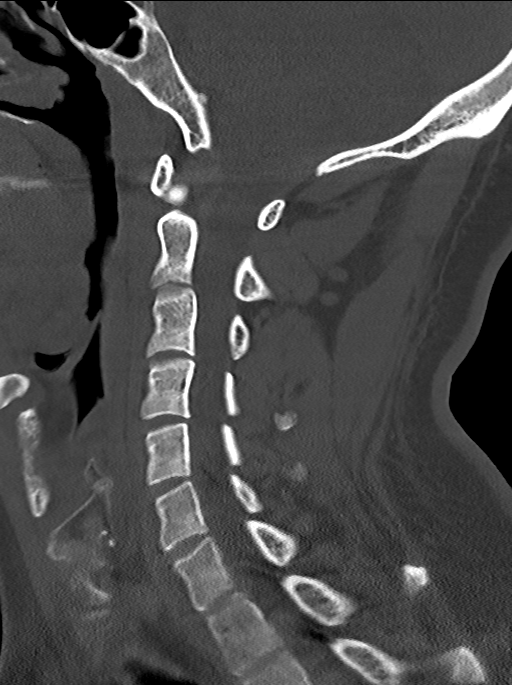
[im 31/61  soft-tissue]
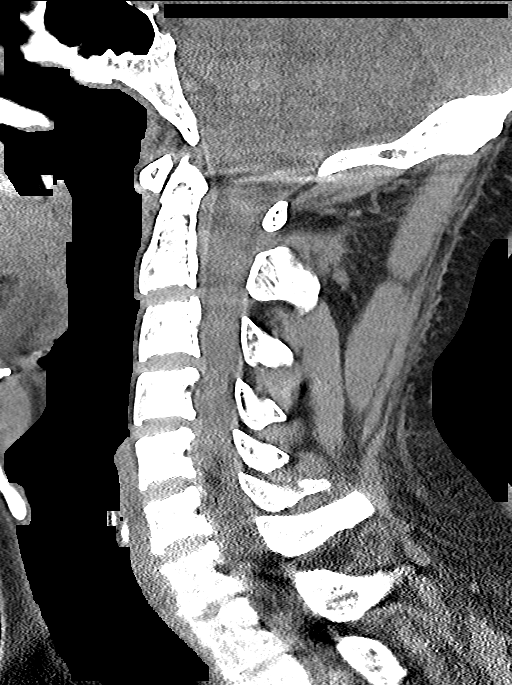
[im 31/61  bone]
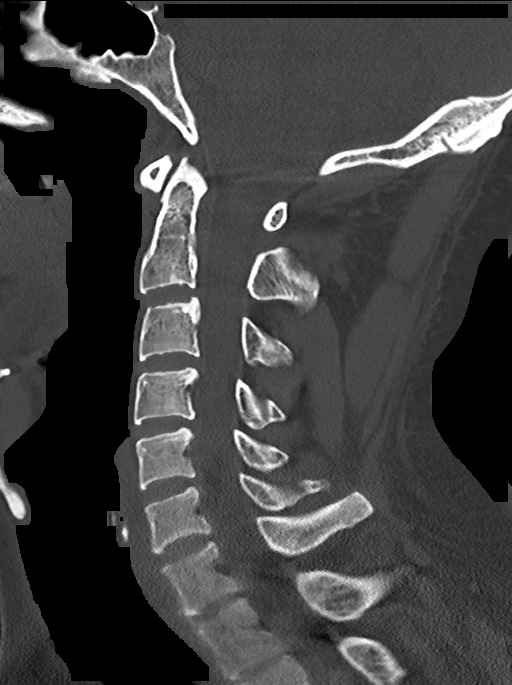
[im 36/61  bone]
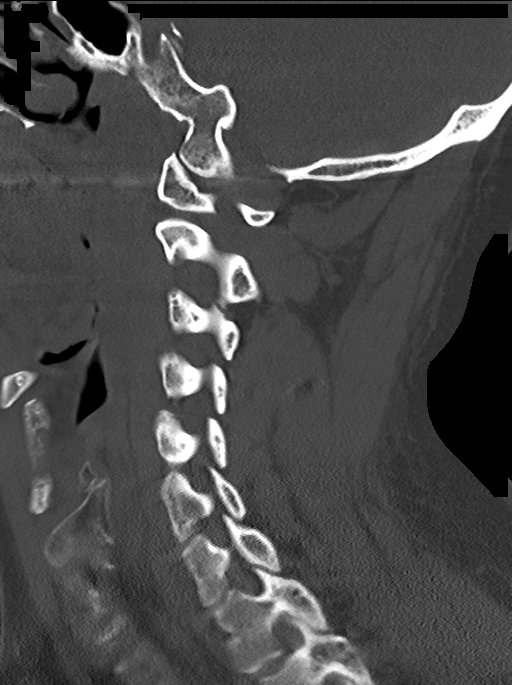
[im 41/61  bone]
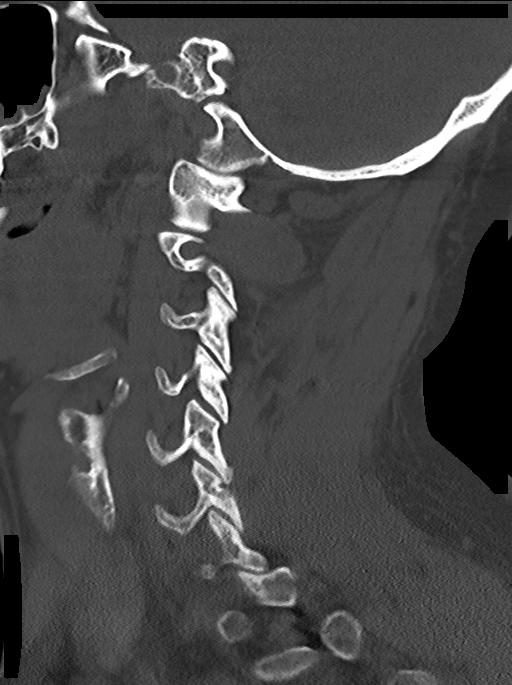

[Series 7: c_spine 2.0 cor bone · coronal · 0.29mm/px · 3 of 63 slices shown]
[im 13/63  bone]
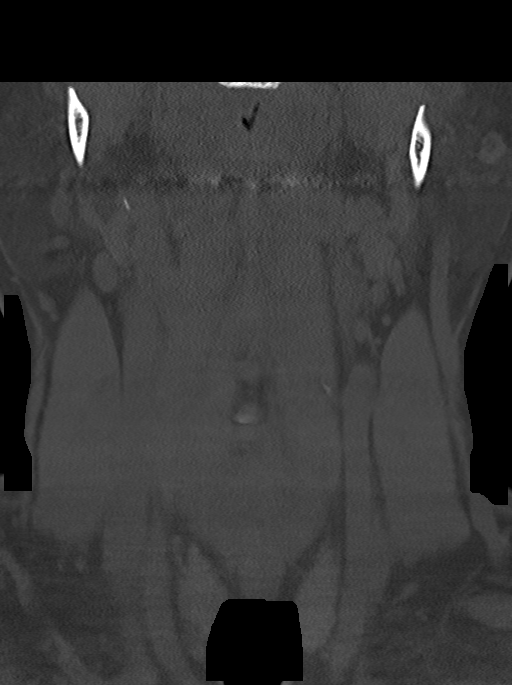
[im 25/63  bone]
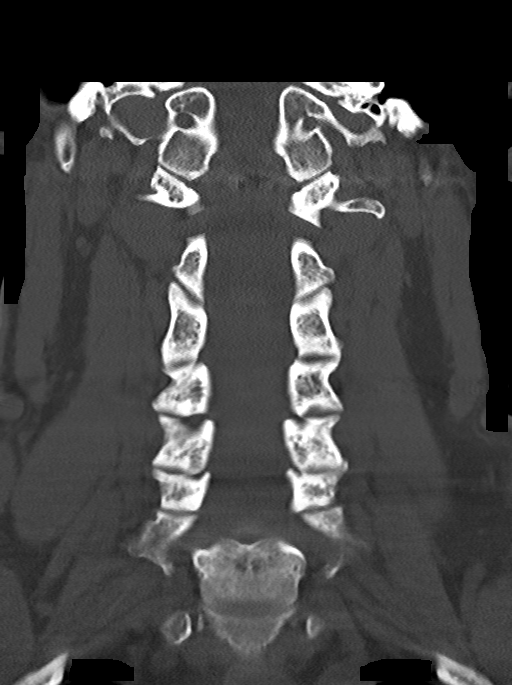
[im 38/63  bone]
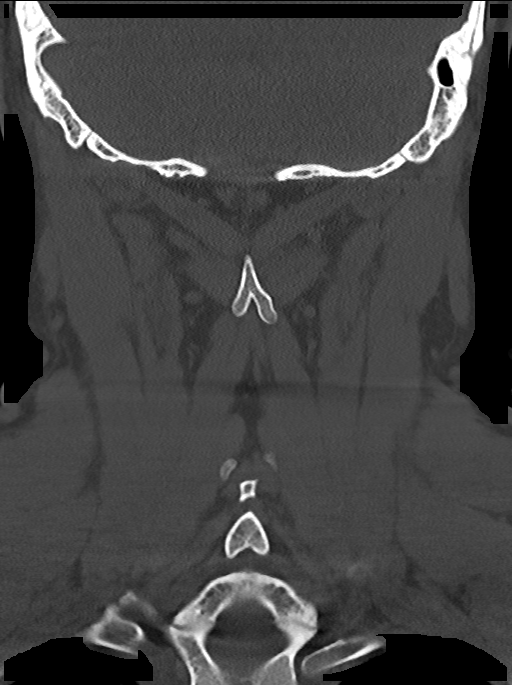

[13 of 33 positions shown; findings below may reference images not displayed]

FINDINGS: Alignment: Preserved cervical lordosis. Cervicothoracic junction
alignment is within normal limits. Bilateral posterior element
alignment is within normal limits.

Skull base and vertebrae: Visualized skull base is intact. No
atlanto-occipital dissociation. No acute osseous abnormality
identified.

Soft tissues and spinal canal: No prevertebral fluid or swelling. No
visible canal hematoma. Negative noncontrast neck soft tissues.

Disc levels:

C2-C3:  Negative.

C3-C4: Small broad-based right paracentral disc protrusion (series
9, image 41). Mild spinal stenosis suspected. No foraminal stenosis.

C4-C5: Small broad-based central disc protrusion on series 9, image
50. Mild spinal stenosis suspected. No foraminal stenosis.

C5-C6:  Mild if any disc bulging.  No stenosis suspected.

C6-C7:  Negative.

C7-T1: Mild disc bulge and endplate spurring suspected, but no
definite stenosis.

Upper chest: Intact visible upper thoracic levels. Negative lung
apices. Negative noncontrast thoracic inlet.

Other: Negative noncontrast visible brain parenchyma. Partially
visible maxillary sinus mucosal thickening or retention cysts.
Tympanic cavities and mastoids are clear.
IMPRESSION: 1. Small cervical disc herniations suspected at C3-C4 and C4-C5 with
up to mild spinal stenosis.
2. No cervical foraminal stenosis is evident. No acute osseous
abnormality.

## 2019-05-10 DIAGNOSIS — F4310 Post-traumatic stress disorder, unspecified: Secondary | ICD-10-CM | POA: Diagnosis not present

## 2019-05-10 DIAGNOSIS — F438 Other reactions to severe stress: Secondary | ICD-10-CM | POA: Diagnosis not present

## 2019-05-24 DIAGNOSIS — F4310 Post-traumatic stress disorder, unspecified: Secondary | ICD-10-CM | POA: Diagnosis not present

## 2019-06-14 DIAGNOSIS — F438 Other reactions to severe stress: Secondary | ICD-10-CM | POA: Diagnosis not present

## 2019-06-14 DIAGNOSIS — F4310 Post-traumatic stress disorder, unspecified: Secondary | ICD-10-CM | POA: Diagnosis not present

## 2019-06-28 DIAGNOSIS — F4310 Post-traumatic stress disorder, unspecified: Secondary | ICD-10-CM | POA: Diagnosis not present

## 2019-07-12 DIAGNOSIS — F438 Other reactions to severe stress: Secondary | ICD-10-CM | POA: Diagnosis not present

## 2019-07-12 DIAGNOSIS — F4310 Post-traumatic stress disorder, unspecified: Secondary | ICD-10-CM | POA: Diagnosis not present

## 2019-07-26 DIAGNOSIS — F4310 Post-traumatic stress disorder, unspecified: Secondary | ICD-10-CM | POA: Diagnosis not present

## 2019-08-09 DIAGNOSIS — F4310 Post-traumatic stress disorder, unspecified: Secondary | ICD-10-CM | POA: Diagnosis not present

## 2019-08-22 DIAGNOSIS — Z23 Encounter for immunization: Secondary | ICD-10-CM | POA: Diagnosis not present

## 2019-08-23 DIAGNOSIS — F4310 Post-traumatic stress disorder, unspecified: Secondary | ICD-10-CM | POA: Diagnosis not present

## 2019-09-19 DIAGNOSIS — Z23 Encounter for immunization: Secondary | ICD-10-CM | POA: Diagnosis not present

## 2019-10-25 DIAGNOSIS — F4310 Post-traumatic stress disorder, unspecified: Secondary | ICD-10-CM | POA: Diagnosis not present

## 2019-10-29 DIAGNOSIS — F438 Other reactions to severe stress: Secondary | ICD-10-CM | POA: Diagnosis not present

## 2019-11-08 DIAGNOSIS — F4310 Post-traumatic stress disorder, unspecified: Secondary | ICD-10-CM | POA: Diagnosis not present

## 2019-11-08 DIAGNOSIS — J029 Acute pharyngitis, unspecified: Secondary | ICD-10-CM | POA: Diagnosis not present

## 2019-11-22 DIAGNOSIS — F4310 Post-traumatic stress disorder, unspecified: Secondary | ICD-10-CM | POA: Diagnosis not present

## 2019-11-22 DIAGNOSIS — F438 Other reactions to severe stress: Secondary | ICD-10-CM | POA: Diagnosis not present

## 2019-12-06 DIAGNOSIS — F4310 Post-traumatic stress disorder, unspecified: Secondary | ICD-10-CM | POA: Diagnosis not present

## 2019-12-10 DIAGNOSIS — F438 Other reactions to severe stress: Secondary | ICD-10-CM | POA: Diagnosis not present

## 2019-12-31 DIAGNOSIS — F438 Other reactions to severe stress: Secondary | ICD-10-CM | POA: Diagnosis not present

## 2020-01-02 DIAGNOSIS — F4310 Post-traumatic stress disorder, unspecified: Secondary | ICD-10-CM | POA: Diagnosis not present

## 2020-01-14 DIAGNOSIS — F438 Other reactions to severe stress: Secondary | ICD-10-CM | POA: Diagnosis not present

## 2020-01-17 DIAGNOSIS — F4310 Post-traumatic stress disorder, unspecified: Secondary | ICD-10-CM | POA: Diagnosis not present

## 2020-01-24 DIAGNOSIS — F438 Other reactions to severe stress: Secondary | ICD-10-CM | POA: Diagnosis not present

## 2020-01-31 DIAGNOSIS — F4310 Post-traumatic stress disorder, unspecified: Secondary | ICD-10-CM | POA: Diagnosis not present

## 2020-02-14 DIAGNOSIS — F4310 Post-traumatic stress disorder, unspecified: Secondary | ICD-10-CM | POA: Diagnosis not present

## 2020-02-28 DIAGNOSIS — F4310 Post-traumatic stress disorder, unspecified: Secondary | ICD-10-CM | POA: Diagnosis not present

## 2020-03-13 DIAGNOSIS — F4310 Post-traumatic stress disorder, unspecified: Secondary | ICD-10-CM | POA: Diagnosis not present

## 2020-03-27 DIAGNOSIS — F4310 Post-traumatic stress disorder, unspecified: Secondary | ICD-10-CM | POA: Diagnosis not present

## 2020-04-08 DIAGNOSIS — F438 Other reactions to severe stress: Secondary | ICD-10-CM | POA: Diagnosis not present

## 2020-04-11 DIAGNOSIS — F4310 Post-traumatic stress disorder, unspecified: Secondary | ICD-10-CM | POA: Diagnosis not present

## 2020-04-24 DIAGNOSIS — F4310 Post-traumatic stress disorder, unspecified: Secondary | ICD-10-CM | POA: Diagnosis not present

## 2020-05-06 DIAGNOSIS — F438 Other reactions to severe stress: Secondary | ICD-10-CM | POA: Diagnosis not present

## 2020-05-07 DIAGNOSIS — F4310 Post-traumatic stress disorder, unspecified: Secondary | ICD-10-CM | POA: Diagnosis not present

## 2020-05-10 DIAGNOSIS — Z20822 Contact with and (suspected) exposure to covid-19: Secondary | ICD-10-CM | POA: Diagnosis not present

## 2020-05-20 DIAGNOSIS — Z0184 Encounter for antibody response examination: Secondary | ICD-10-CM | POA: Diagnosis not present

## 2020-05-22 DIAGNOSIS — F4310 Post-traumatic stress disorder, unspecified: Secondary | ICD-10-CM | POA: Diagnosis not present

## 2020-06-05 DIAGNOSIS — F4310 Post-traumatic stress disorder, unspecified: Secondary | ICD-10-CM | POA: Diagnosis not present
